# Patient Record
Sex: Male | Born: 1985 | Race: White | Hispanic: No | Marital: Single
Health system: Southern US, Community
[De-identification: ages and names within clinical notes are randomized; demographics above are authoritative.]

---

## 2007-07-06 ENCOUNTER — Emergency Department (HOSPITAL_COMMUNITY): Admission: EM | Admit: 2007-07-06 | Discharge: 2007-07-06 | Payer: Self-pay | Admitting: Emergency Medicine

## 2009-07-16 IMAGING — CR DG ANKLE COMPLETE 3+V*R*
3 series · 3 of 3 positions shown · non-contrast
Comparison: None

CLINICAL DATA: Twisted ankle

RIGHT ANKLE - COMPLETE 3+ VIEW

[t ankle joint ap right]
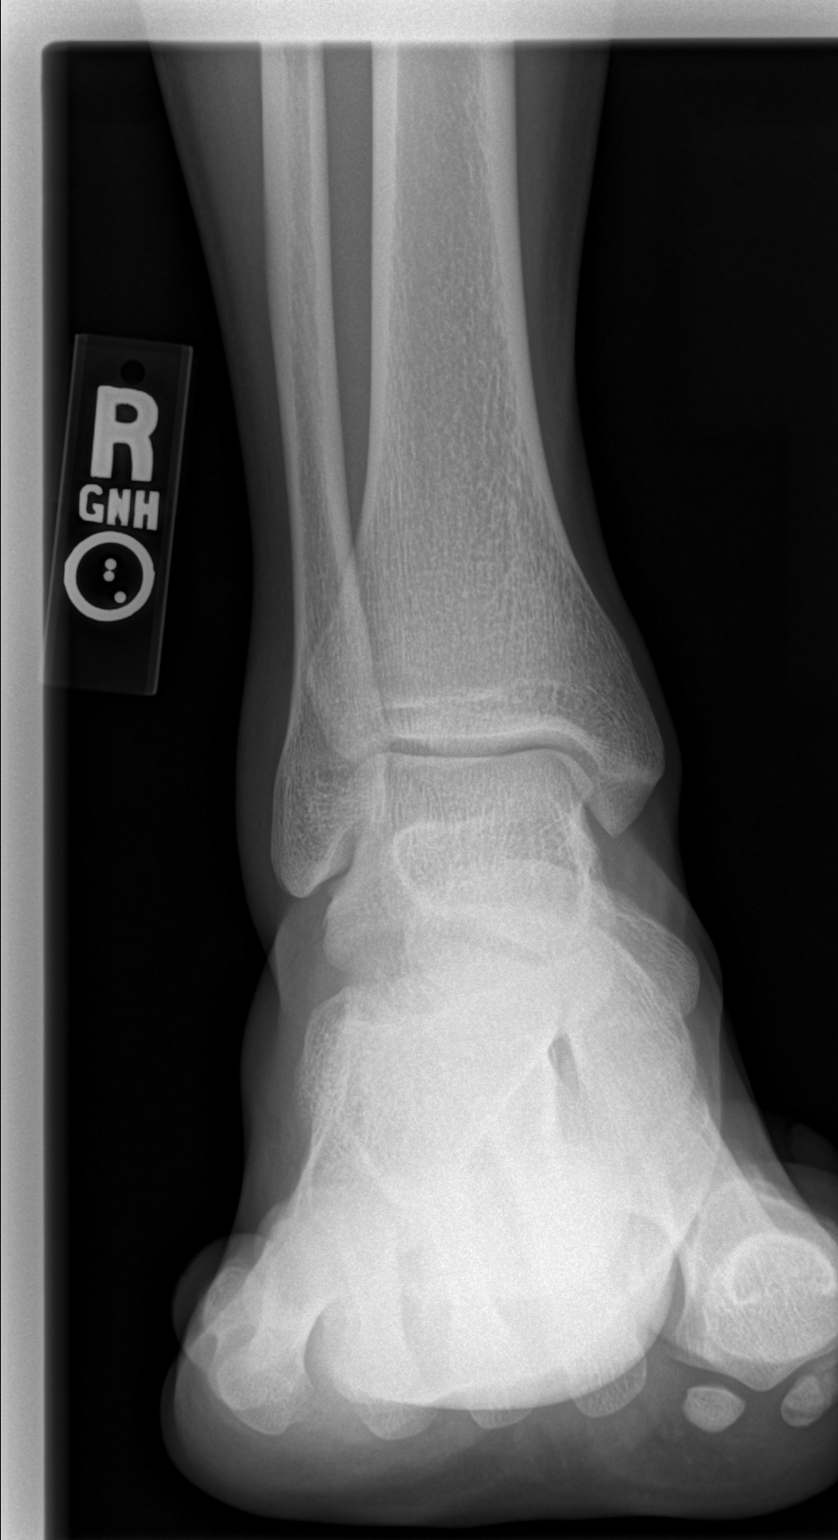

[t ankle joint oblique right]
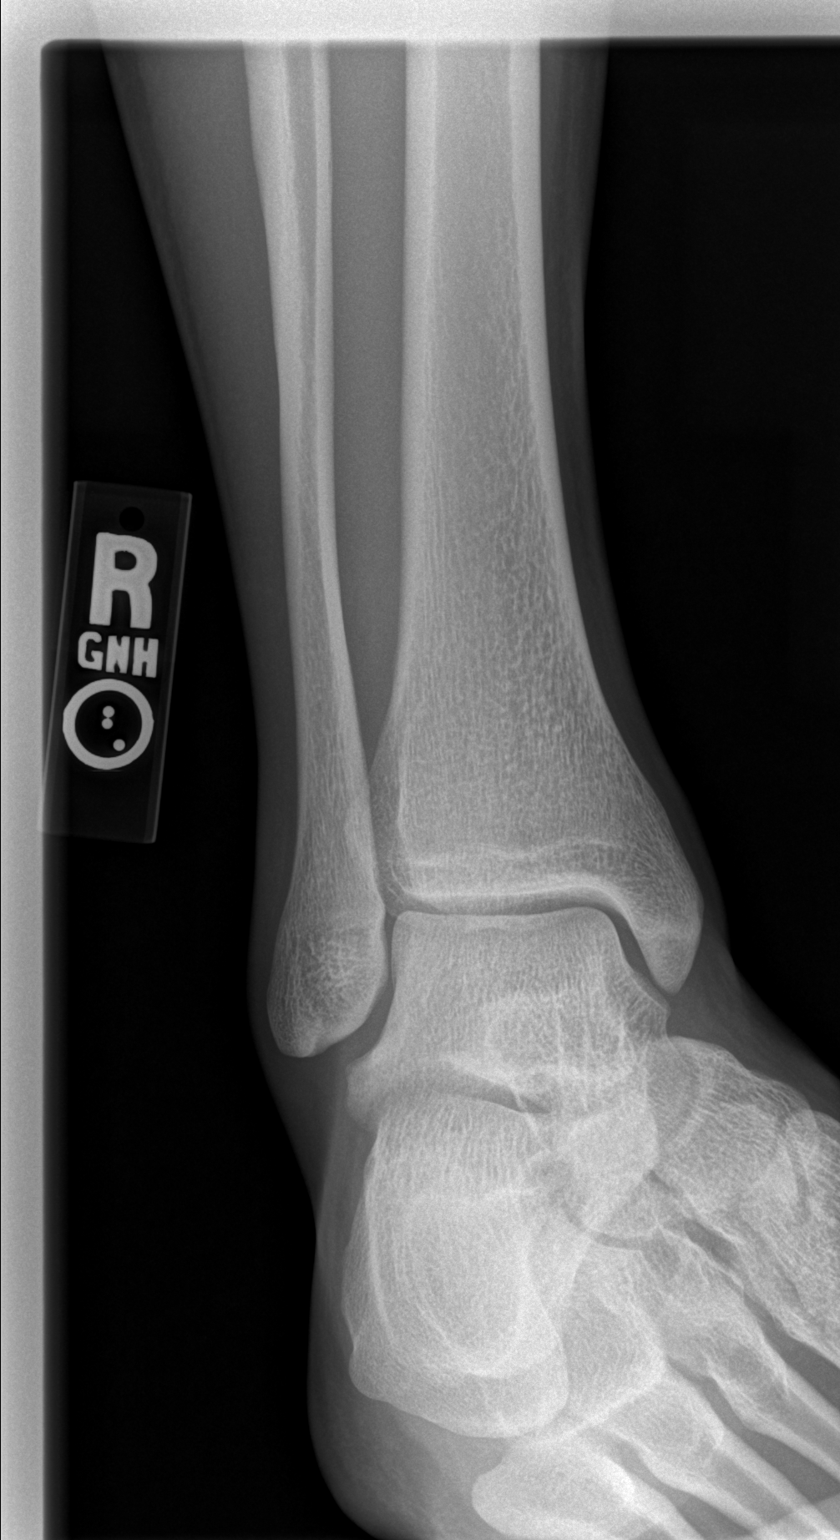

[t ankle joint lat right]
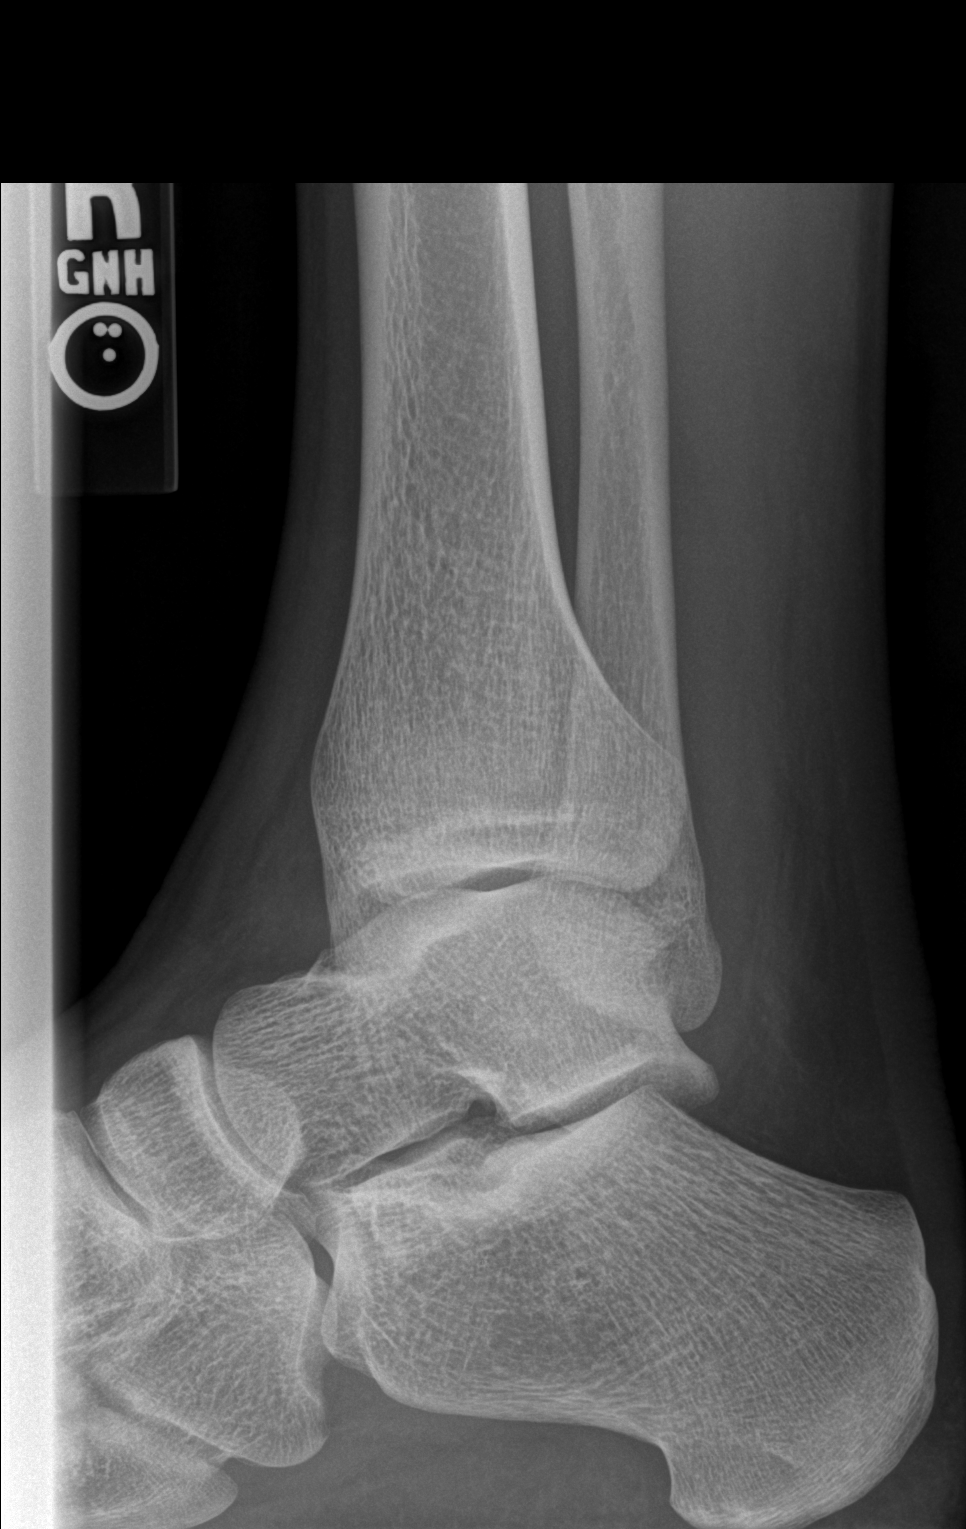

[3 of 3 positions shown; findings below may reference images not displayed]

FINDINGS: The ankle mortise is maintained.  No fractures are seen.
No osteochondral defects.
IMPRESSION: 1.  No acute bony findings.

## 2014-06-09 ENCOUNTER — Telehealth: Payer: Self-pay | Admitting: *Deleted

## 2014-06-09 ENCOUNTER — Other Ambulatory Visit: Payer: Self-pay | Admitting: *Deleted

## 2014-06-09 DIAGNOSIS — R1084 Generalized abdominal pain: Secondary | ICD-10-CM

## 2014-06-09 NOTE — Telephone Encounter (Signed)
Antonio Barajas called in to triage to report that her husband was seen at [facility] last night "doubled over with stomach pains". 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 Antonio Barajas told the ED that the patient could be scheduled to see Antonio Barajas as an outpatient. This morning the wife was insisting that Antonio Barajas see him today. Antonio Barajas reviewed the CTA and did not find anything acute that would warrant seeing him today. Per Antonio Barajas, Mr. Antonio Barajas needs to have a mesenteric duplex and office appt whenever available to evaluate this "stenosis" further. According to wife, Mr. Antonio Barajas stopped smoking on 05-19-14 and that "she had slipped him some of her Percocet that she got when she had surgery" She states that "the ED gave him Morphine last night and that helped". She says that he was seen this afternoon by Antonio Bar[place] in [place] for "Lovenox" bloodwork.  I told her that we have not seen the patient formally and that we would not be giving him narcotics. Our scheduling office will contact patient and make him an appt with duplex. Antonio Barajas voiced agreement and understanding of this plan.

## 2016-02-08 DIAGNOSIS — R05 Cough: Secondary | ICD-10-CM | POA: Diagnosis not present

## 2016-02-08 DIAGNOSIS — R51 Headache: Secondary | ICD-10-CM | POA: Diagnosis not present

## 2016-02-08 DIAGNOSIS — R0981 Nasal congestion: Secondary | ICD-10-CM | POA: Diagnosis not present

## 2016-02-08 DIAGNOSIS — M791 Myalgia: Secondary | ICD-10-CM | POA: Diagnosis not present

## 2016-05-14 DIAGNOSIS — B009 Herpesviral infection, unspecified: Secondary | ICD-10-CM | POA: Diagnosis not present

## 2016-11-29 DIAGNOSIS — M79651 Pain in right thigh: Secondary | ICD-10-CM | POA: Diagnosis not present

## 2016-11-29 DIAGNOSIS — M79604 Pain in right leg: Secondary | ICD-10-CM | POA: Diagnosis not present

## 2016-11-30 DIAGNOSIS — M79604 Pain in right leg: Secondary | ICD-10-CM | POA: Diagnosis not present

## 2017-12-06 DIAGNOSIS — Z0000 Encounter for general adult medical examination without abnormal findings: Secondary | ICD-10-CM | POA: Diagnosis not present

## 2017-12-06 DIAGNOSIS — Z136 Encounter for screening for cardiovascular disorders: Secondary | ICD-10-CM | POA: Diagnosis not present

## 2018-01-10 DIAGNOSIS — B001 Herpesviral vesicular dermatitis: Secondary | ICD-10-CM | POA: Diagnosis not present

## 2020-02-17 DIAGNOSIS — U071 COVID-19: Secondary | ICD-10-CM | POA: Diagnosis not present

## 2020-10-18 DIAGNOSIS — Z0000 Encounter for general adult medical examination without abnormal findings: Secondary | ICD-10-CM | POA: Diagnosis not present

## 2020-10-18 DIAGNOSIS — Z13220 Encounter for screening for lipoid disorders: Secondary | ICD-10-CM | POA: Diagnosis not present

## 2021-09-04 DIAGNOSIS — J209 Acute bronchitis, unspecified: Secondary | ICD-10-CM | POA: Diagnosis not present

## 2021-09-04 DIAGNOSIS — B009 Herpesviral infection, unspecified: Secondary | ICD-10-CM | POA: Diagnosis not present

## 2021-11-08 DIAGNOSIS — Z03818 Encounter for observation for suspected exposure to other biological agents ruled out: Secondary | ICD-10-CM | POA: Diagnosis not present

## 2021-11-08 DIAGNOSIS — R051 Acute cough: Secondary | ICD-10-CM | POA: Diagnosis not present

## 2021-11-08 DIAGNOSIS — J069 Acute upper respiratory infection, unspecified: Secondary | ICD-10-CM | POA: Diagnosis not present

## 2022-02-26 DIAGNOSIS — J020 Streptococcal pharyngitis: Secondary | ICD-10-CM | POA: Diagnosis not present

## 2022-02-26 DIAGNOSIS — U071 COVID-19: Secondary | ICD-10-CM | POA: Diagnosis not present
